# Patient Record
Sex: Male | Born: 1970
Health system: Southern US, Community
[De-identification: ages and names within clinical notes are randomized; demographics above are authoritative.]

## PROBLEM LIST (undated history)

## (undated) DIAGNOSIS — D802 Selective deficiency of immunoglobulin A [IgA]: Secondary | ICD-10-CM

## (undated) DIAGNOSIS — J159 Unspecified bacterial pneumonia: Secondary | ICD-10-CM

## (undated) DIAGNOSIS — K219 Gastro-esophageal reflux disease without esophagitis: Secondary | ICD-10-CM

## (undated) HISTORY — DX: Gastro-esophageal reflux disease without esophagitis: K21.9

## (undated) HISTORY — PX: MOUTH SURGERY: SHX715

## (undated) HISTORY — DX: Unspecified bacterial pneumonia: J15.9

## (undated) HISTORY — PX: ESOPHAGOGASTRODUODENOSCOPY: SHX1529

## (undated) HISTORY — PX: VASECTOMY: SHX75

---

## 2003-01-03 ENCOUNTER — Encounter: Admission: RE | Admit: 2003-01-03 | Discharge: 2003-01-03 | Payer: Self-pay | Admitting: Family Medicine

## 2003-01-03 ENCOUNTER — Encounter: Payer: Self-pay | Admitting: Family Medicine

## 2003-07-22 ENCOUNTER — Ambulatory Visit (HOSPITAL_COMMUNITY): Admission: RE | Admit: 2003-07-22 | Discharge: 2003-07-22 | Payer: Self-pay | Admitting: Family Medicine

## 2003-07-22 ENCOUNTER — Emergency Department (HOSPITAL_COMMUNITY): Admission: EM | Admit: 2003-07-22 | Discharge: 2003-07-22 | Payer: Self-pay | Admitting: Emergency Medicine

## 2009-08-08 DIAGNOSIS — J159 Unspecified bacterial pneumonia: Secondary | ICD-10-CM

## 2009-08-08 HISTORY — DX: Unspecified bacterial pneumonia: J15.9

## 2009-08-26 ENCOUNTER — Inpatient Hospital Stay (HOSPITAL_COMMUNITY): Admission: AD | Admit: 2009-08-26 | Discharge: 2009-08-29 | Payer: Self-pay | Admitting: Internal Medicine

## 2009-09-27 ENCOUNTER — Encounter: Admission: RE | Admit: 2009-09-27 | Discharge: 2009-09-27 | Payer: Self-pay | Admitting: Family Medicine

## 2010-09-02 LAB — DIFFERENTIAL
Basophils Absolute: 0 10*3/uL (ref 0.0–0.1)
Basophils Absolute: 0 10*3/uL (ref 0.0–0.1)
Eosinophils Absolute: 0 10*3/uL (ref 0.0–0.7)
Eosinophils Relative: 0 % (ref 0–5)
Lymphocytes Relative: 14 % (ref 12–46)
Lymphocytes Relative: 5 % — ABNORMAL LOW (ref 12–46)
Lymphs Abs: 1.7 10*3/uL (ref 0.7–4.0)
Monocytes Relative: 3 % (ref 3–12)
Neutro Abs: 17.7 10*3/uL — ABNORMAL HIGH (ref 1.7–7.7)
Neutrophils Relative %: 80 % — ABNORMAL HIGH (ref 43–77)
Neutrophils Relative %: 92 % — ABNORMAL HIGH (ref 43–77)

## 2010-09-02 LAB — CULTURE, BLOOD (ROUTINE X 2): Culture: NO GROWTH

## 2010-09-02 LAB — EXPECTORATED SPUTUM ASSESSMENT W GRAM STAIN, RFLX TO RESP C

## 2010-09-02 LAB — COMPREHENSIVE METABOLIC PANEL
Albumin: 2 g/dL — ABNORMAL LOW (ref 3.5–5.2)
BUN: 25 mg/dL — ABNORMAL HIGH (ref 6–23)
Creatinine, Ser: 1.08 mg/dL (ref 0.4–1.5)
Glucose, Bld: 102 mg/dL — ABNORMAL HIGH (ref 70–99)
Total Bilirubin: 0.8 mg/dL (ref 0.3–1.2)
Total Protein: 6.3 g/dL (ref 6.0–8.3)

## 2010-09-02 LAB — BASIC METABOLIC PANEL
BUN: 18 mg/dL (ref 6–23)
CO2: 27 mEq/L (ref 19–32)
Calcium: 7.9 mg/dL — ABNORMAL LOW (ref 8.4–10.5)
Calcium: 8.1 mg/dL — ABNORMAL LOW (ref 8.4–10.5)
Chloride: 103 mEq/L (ref 96–112)
Chloride: 107 mEq/L (ref 96–112)
Creatinine, Ser: 0.9 mg/dL (ref 0.4–1.5)
Creatinine, Ser: 0.96 mg/dL (ref 0.4–1.5)
GFR calc Af Amer: >60 mL/min (ref >60)
GFR calc Af Amer: >60 mL/min (ref >60)
GFR calc non Af Amer: >60 mL/min (ref >60)
Glucose, Bld: 100 mg/dL — ABNORMAL HIGH (ref 70–99)
Potassium: 3.7 mEq/L (ref 3.5–5.1)
Potassium: 4.4 mEq/L (ref 3.5–5.1)
Sodium: 136 mEq/L (ref 135–145)
Sodium: 140 mEq/L (ref 135–145)

## 2010-09-02 LAB — URINALYSIS, ROUTINE W REFLEX MICROSCOPIC
Bilirubin Urine: NEGATIVE
Glucose, UA: NEGATIVE mg/dL
Hgb urine dipstick: NEGATIVE
Ketones, ur: NEGATIVE mg/dL
Protein, ur: 100 mg/dL — AB
Urobilinogen, UA: 1 mg/dL (ref 0.0–1.0)

## 2010-09-02 LAB — CBC
HCT: 33.3 % — ABNORMAL LOW (ref 39.0–52.0)
HCT: 35 % — ABNORMAL LOW (ref 39.0–52.0)
HCT: 35.3 % — ABNORMAL LOW (ref 39.0–52.0)
Hemoglobin: 11.6 g/dL — ABNORMAL LOW (ref 13.0–17.0)
Hemoglobin: 11.8 g/dL — ABNORMAL LOW (ref 13.0–17.0)
Hemoglobin: 11.9 g/dL — ABNORMAL LOW (ref 13.0–17.0)
MCHC: 33.8 g/dL (ref 30.0–36.0)
MCV: 89.3 fL (ref 78.0–100.0)
MCV: 89.4 fL (ref 78.0–100.0)
Platelets: 254 10*3/uL (ref 150–400)
Platelets: 286 10*3/uL (ref 150–400)
RBC: 3.82 MIL/uL — ABNORMAL LOW (ref 4.22–5.81)
RBC: 3.93 MIL/uL — ABNORMAL LOW (ref 4.22–5.81)
RDW: 13.4 % (ref 11.5–15.5)
RDW: 13.9 % (ref 11.5–15.5)
WBC: 12.3 10*3/uL — ABNORMAL HIGH (ref 4.0–10.5)
WBC: 9.7 10*3/uL (ref 4.0–10.5)

## 2010-09-02 LAB — MAGNESIUM: Magnesium: 2.5 mg/dL (ref 1.5–2.5)

## 2010-09-02 LAB — RAPID STREP SCREEN (MED CTR MEBANE ONLY): Streptococcus, Group A Screen (Direct): NEGATIVE

## 2010-09-02 LAB — LEGIONELLA ANTIGEN, URINE: Legionella Antigen, Urine: NEGATIVE

## 2010-09-02 LAB — CULTURE, RESPIRATORY W GRAM STAIN: Culture: NORMAL

## 2010-09-02 LAB — HIV ANTIBODY (ROUTINE TESTING W REFLEX): HIV: NONREACTIVE

## 2010-09-02 LAB — URINE MICROSCOPIC-ADD ON

## 2011-04-21 IMAGING — CR DG CHEST 2V
2 series · 2 of 2 positions shown · non-contrast
Comparison: None.

CLINICAL DATA: Pneumonia.

CHEST - 2 VIEW

[w chest pa]
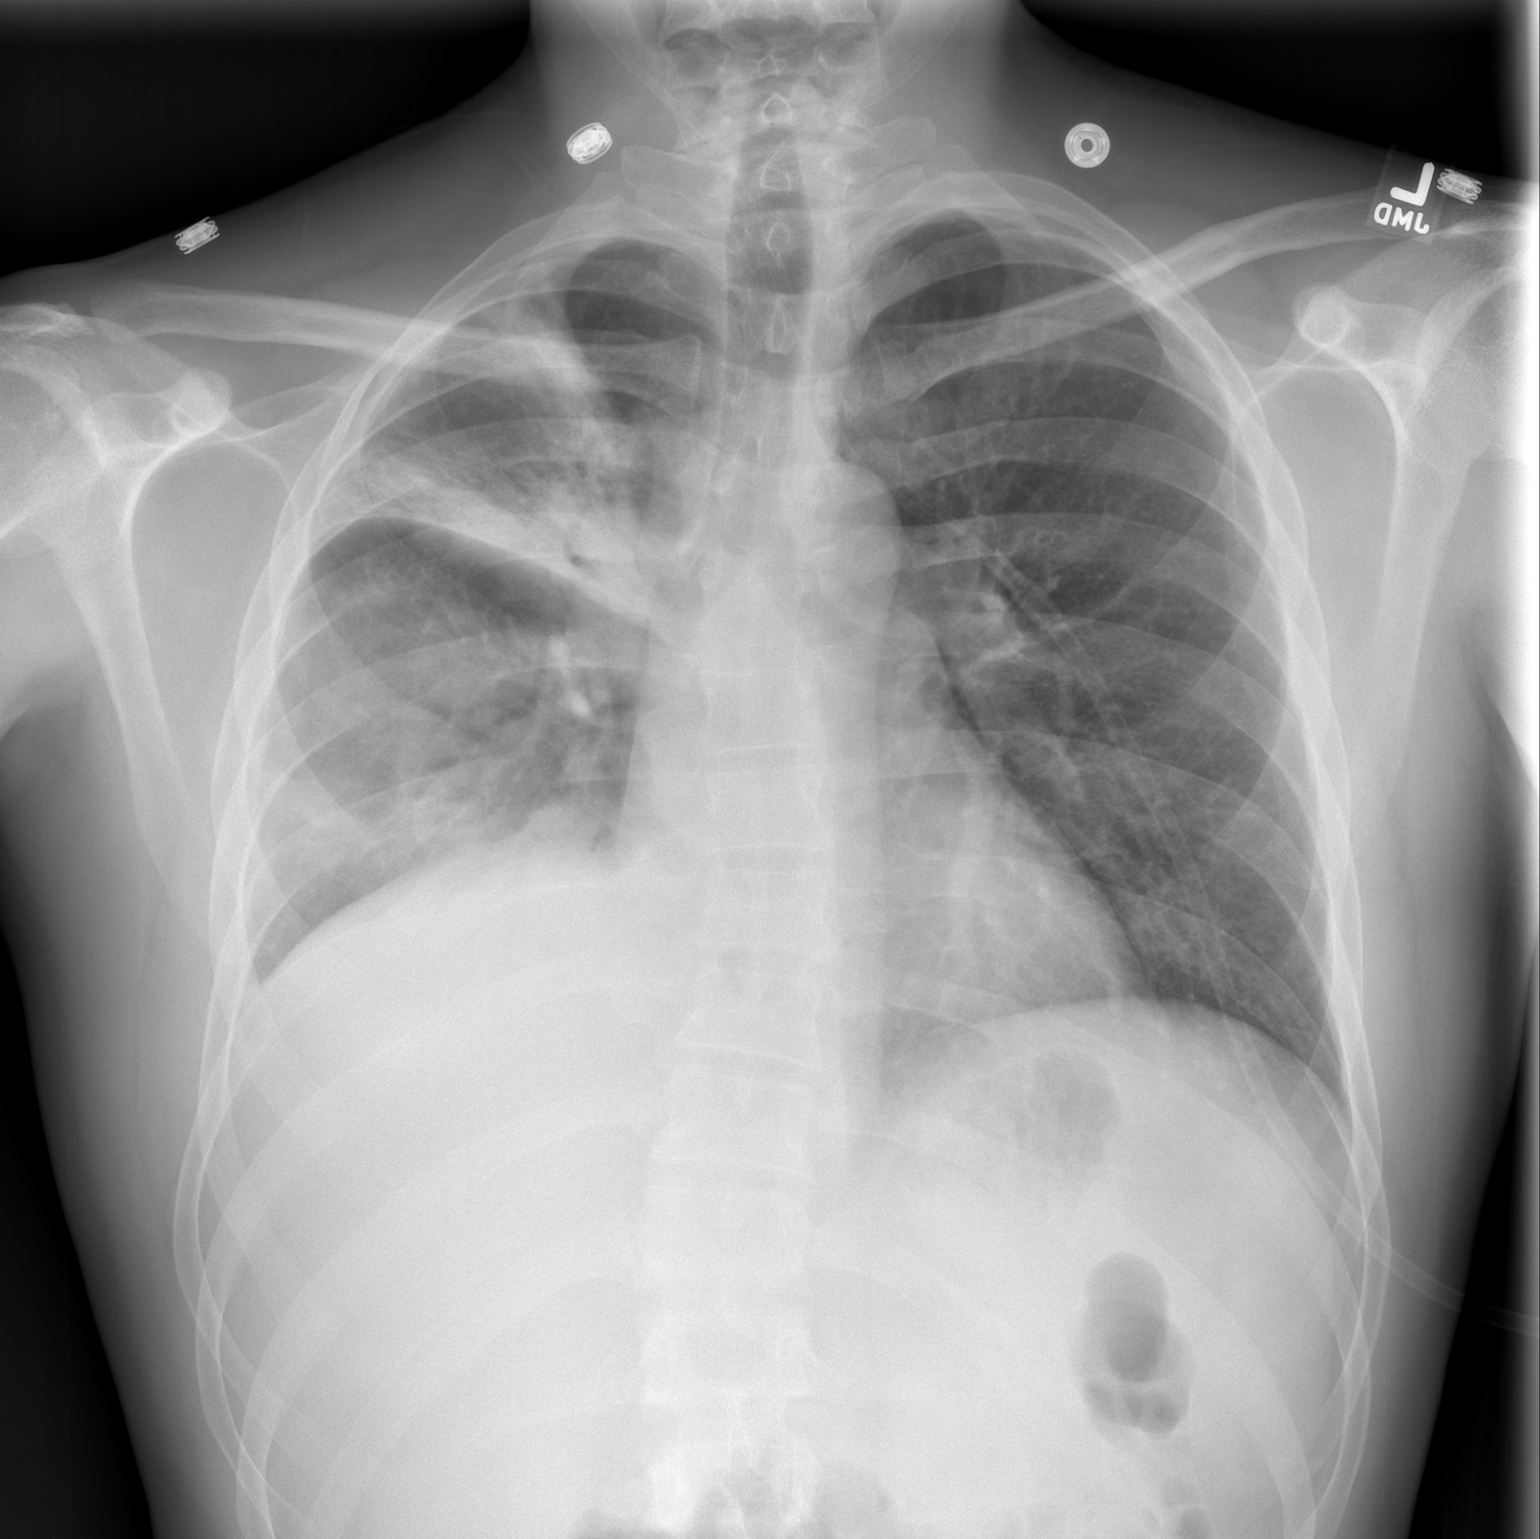

[w chest lat]
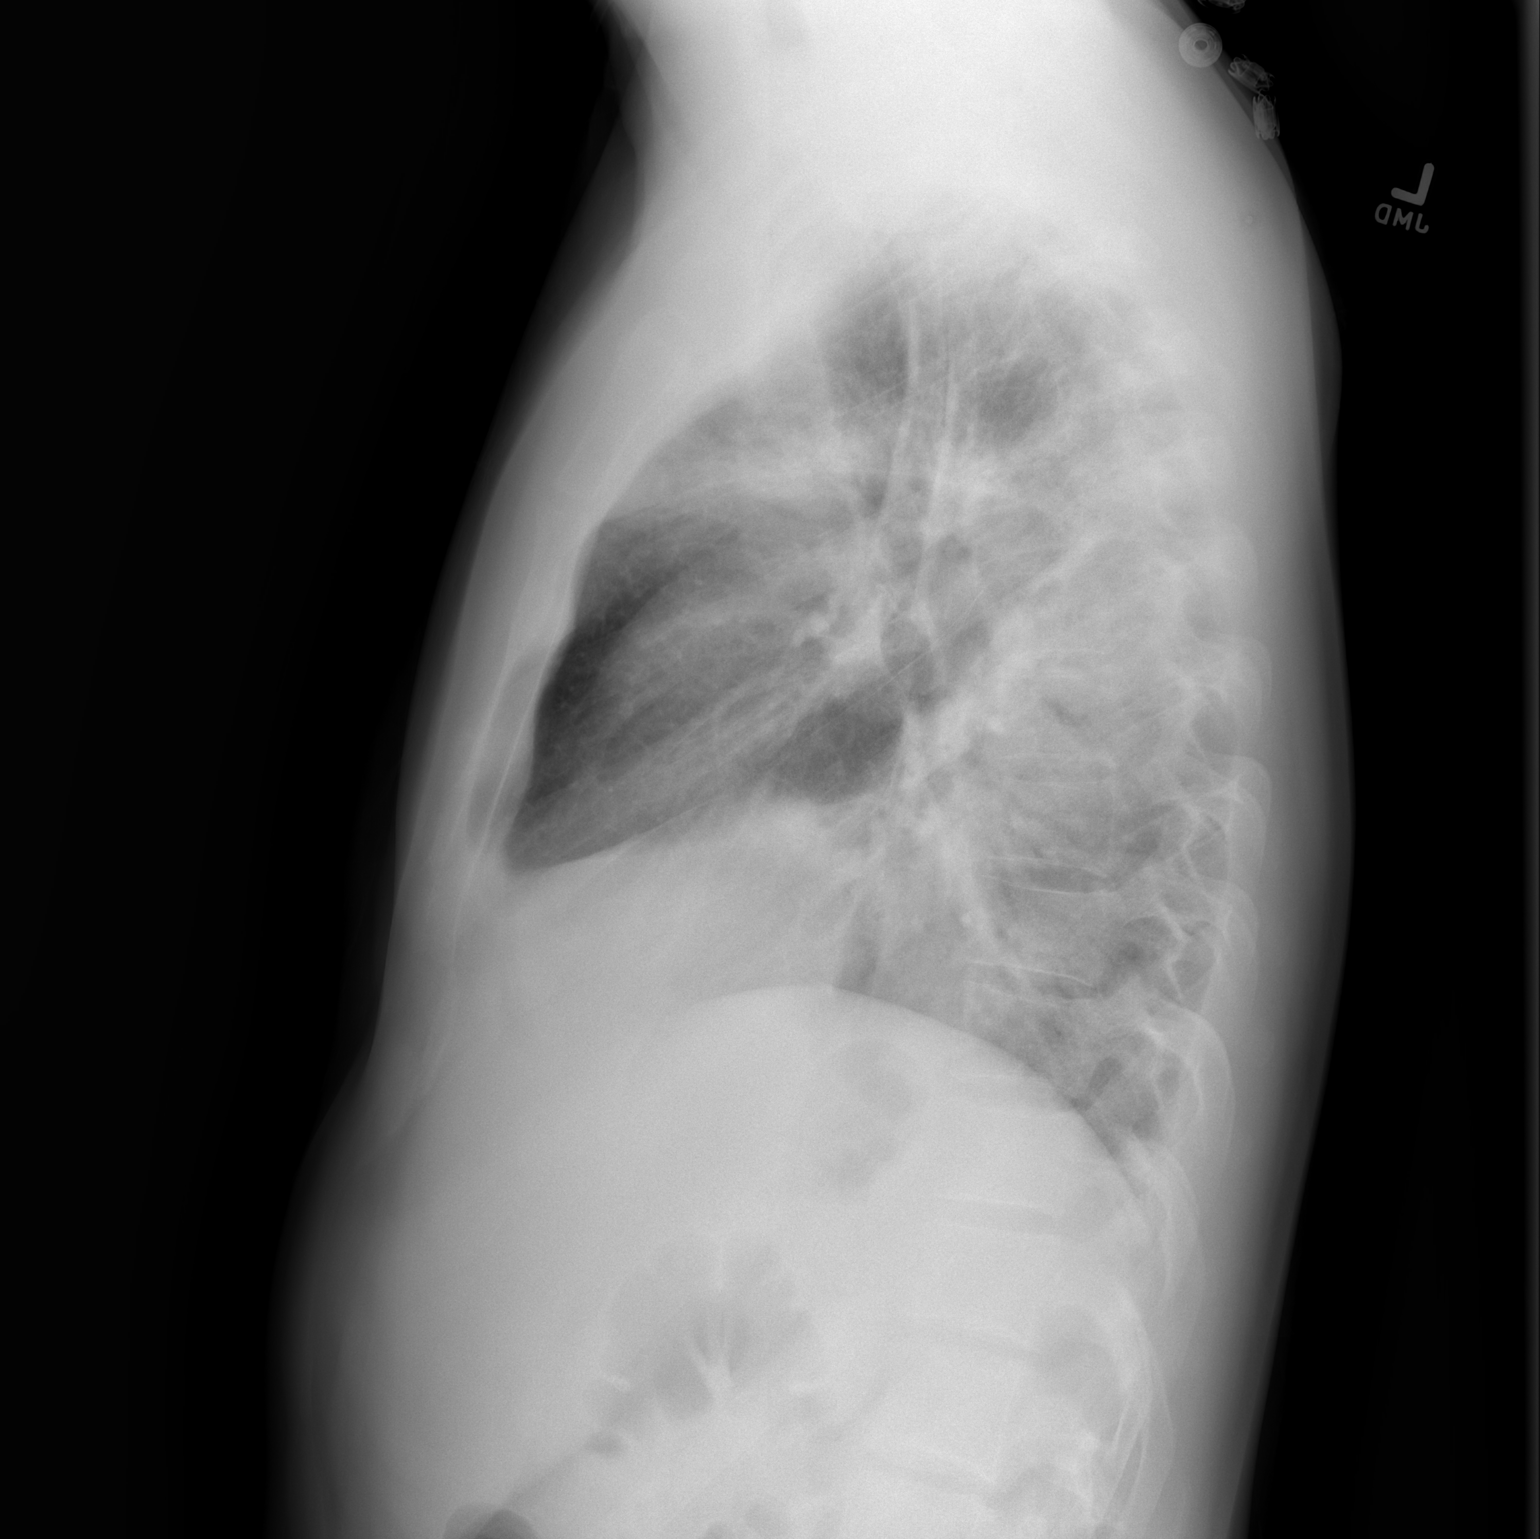

[2 of 2 positions shown; findings below may reference images not displayed]

FINDINGS: The patient has extensive airspace disease in the right
upper, right middle and right lower lobes.  There is a small right
effusion.  Left lung is clear.
IMPRESSION: Extensive right side airspace disease consistent with pneumonia.
Recommend follow-up films to clearing.

## 2011-07-04 ENCOUNTER — Encounter: Payer: Self-pay | Admitting: Emergency Medicine

## 2011-07-05 ENCOUNTER — Encounter: Payer: Self-pay | Admitting: Emergency Medicine

## 2011-07-05 ENCOUNTER — Ambulatory Visit (INDEPENDENT_AMBULATORY_CARE_PROVIDER_SITE_OTHER): Payer: BC Managed Care – PPO | Admitting: Emergency Medicine

## 2011-07-05 ENCOUNTER — Ambulatory Visit: Payer: BC Managed Care – PPO

## 2011-07-05 DIAGNOSIS — J189 Pneumonia, unspecified organism: Secondary | ICD-10-CM

## 2011-07-05 DIAGNOSIS — K219 Gastro-esophageal reflux disease without esophagitis: Secondary | ICD-10-CM | POA: Insufficient documentation

## 2011-07-05 DIAGNOSIS — J159 Unspecified bacterial pneumonia: Secondary | ICD-10-CM

## 2011-07-05 DIAGNOSIS — J309 Allergic rhinitis, unspecified: Secondary | ICD-10-CM | POA: Insufficient documentation

## 2011-07-05 NOTE — Patient Instructions (Signed)
We will check your immunoglobulins and call you with the results Consider starting an allergy regimen during the Spring and fall months.  If you experience another pneumonia, I would recommend a high resolution CT scan of your chest. Please call Dr Delton Coombes if you are treated for pneumonia or develop any problems.

## 2011-07-05 NOTE — Assessment & Plan Note (Addendum)
Recurrent PNA's can be caused by chronic aspiration and reflux, or influenced by chronic allergic rhinitis. More often PNA's recur due to anatomical changes such as bronchiectasis, whatever the underlying cause. There is no evidence here to suggest a post obstructive process. His more recent left upper lobe pneumonia from December 2012 is a very subtle finding on chest x-ray. It may be that this was truly a bronchitis which is somewhat reassuring. The fact that his initial infection was in the right upper lobe while the second episode may have involved the left upper lobe makes an anatomical cause unlikely. At this point I believe we should rule out simple immunoglobulin deficiency. If he shows evidence of recurrent infection or if his chest x-ray changes such that we are more concerned about bronchiectasis then I would favor a high-resolution CT scan of the chest.  - check quant immunoglob - consider high res CT scan if he has a recurrence - I will call him with lab results

## 2011-07-05 NOTE — Assessment & Plan Note (Signed)
It may be beneficial to treat him for allergic rhinitis during the spring and fall, This may decrease overall airway inflammation and his chances to develop recurrent infection.

## 2011-07-05 NOTE — Progress Notes (Signed)
Subjective:    Patient ID: Donald Warner, male    DOB: 1970-09-09, 41 y.o.   MRN: [id number] HPI 41 yo man, very light tobacco hx, hx allergic rhinitis, GERD. He is referred by Dr Manus Gunning regarding 2 PNA's in the last 2 years, the first in 08/2009, the more recent in 05/2011. 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 Has lived in [place], no [employer]. No family hx of PNA's.   Denies aspiration symptoms, has always been active and able to workout. He has hx frequent sinus drainage in the winter months. He has allergy sx in the Spring and Fall with a lot of drainage, hoarse voice. Taking PPI prn for occasional heartburn.   In 2011 he had flu then developed RUL infiltrate and PNA. In 12/12 he developed what sounded like URI, then became febrile, had chills, CXR showed ? LUL infiltrate. Was treated with Levaquin.    Review of Systems  Constitutional: Negative.  Negative for fever and unexpected weight change.  HENT: Negative.  Negative for ear pain, nosebleeds, congestion, sore throat, rhinorrhea, sneezing, trouble swallowing, dental problem, postnasal drip and sinus pressure.   Eyes: Negative.  Negative for redness and itching.  Respiratory: Positive for shortness of breath. Negative for cough, chest tightness and wheezing.   Cardiovascular: Negative.  Negative for palpitations and leg swelling.  Gastrointestinal: Negative.  Negative for nausea and vomiting.  Genitourinary: Negative.  Negative for dysuria.  Musculoskeletal: Negative.  Negative for joint swelling.  Skin: Negative.  Negative for rash.  Neurological: Negative.  Negative for headaches.  Hematological: Negative.  Does not bruise/bleed easily.  Psychiatric/Behavioral: Negative.  Negative for dysphoric mood. The patient is not nervous/anxious.     Past Medical History  Diagnosis Date  . GERD (gastroesophageal reflux disease)   . Bacterial pneumonia March 2011  . ALLERGIC RHINITIS        Family History  Problem Relation Age of Onset  . Heart disease Father     s/p CABG  . Lung cancer Paternal Grandmother   . Emphysema Paternal Grandfather      History   Social History  . Marital Status: Married    Spouse Name: N/A    Number of Children: 3  . Years of Education: N/A   Occupational History  . Sales    Social History Main Topics  . Smoking status: Former Smoker -- 0.1 packs/day for 3 years    Types: Cigarettes    Quit date: 06/10/1988  . Smokeless tobacco: Never Used  . Alcohol Use: No  . Drug Use: No  . Sexually Active: Not on file   Other Topics Concern  . Not on file   Social History Narrative  . No narrative on file     Allergies not on file   Outpatient Prescriptions Prior to Visit  Medication Sig Dispense Refill  . aspirin 81 MG tablet Take 81 mg by mouth daily.      . cholecalciferol (VITAMIN D) 1000 UNITS tablet Take 1,000 Units by mouth daily.      Marland Kitchen dexlansoprazole (DEXILANT) 60 MG capsule Take 60 mg by mouth daily as needed.            Objective:   Physical Exam  Gen: Pleasant, well-nourished, in no distress,  normal affect  ENT: No lesions,  mouth clear,  oropharynx clear, no postnasal drip  Neck: No JVD, no TMG, no carotid bruits  Lungs: No use of accessory muscles,  no dullness to percussion, clear without rales or rhonchi  Cardiovascular: RRR, heart sounds normal, no murmur or gallops, no peripheral edema  Musculoskeletal: No deformities, no cyanosis or clubbing  Neuro: alert, non focal  Skin: Warm, no lesions or rashes     Assessment & Plan:  Pneumonia Recurrent PNA's can be caused by chronic aspiration and reflux, or influenced by chronic allergic rhinitis. More often PNA's recur due to anatomical changes such as bronchiectasis, whatever the underlying cause. There is no evidence here to suggest a post obstructive process. His more recent left upper lobe pneumonia from December 2012 is a very subtle finding on chest  x-ray. It may be that this was truly a bronchitis which is somewhat reassuring. The fact that his initial infection was in the right upper lobe while the second episode may have involved the left upper lobe makes an anatomical cause unlikely. At this point I believe we should rule out simple immunoglobulin deficiency. If he shows evidence of recurrent infection or if his chest x-ray changes such that we are more concerned about bronchiectasis then I would favor a high-resolution CT scan of the chest.  - check quant immunoglob - consider high res CT scan if he has a recurrence - I will call him with lab results  Allergic rhinitis It may be beneficial to treat him for allergic rhinitis during the spring and fall, This may decrease overall airway inflammation and his chances to develop recurrent infection.

## 2011-07-06 LAB — IGG, IGA, IGM: IgM, Serum: 86 mg/dL (ref 41–251)

## 2011-07-09 ENCOUNTER — Telehealth: Payer: Self-pay | Admitting: Emergency Medicine

## 2011-07-09 NOTE — Telephone Encounter (Signed)
Pt is requesting lab results from 07-05-11. Please advise. Carron Curie, CMA

## 2011-07-09 NOTE — Telephone Encounter (Signed)
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 I discussed this with him. He is at higher risk for sinopulmonary infections, will need good vaccinations, to avoid URI's, to treat allergies. He is going to discuss status with his PCP.

## 2012-04-15 ENCOUNTER — Other Ambulatory Visit: Payer: Self-pay | Admitting: Family Medicine

## 2012-04-15 ENCOUNTER — Ambulatory Visit
Admission: RE | Admit: 2012-04-15 | Discharge: 2012-04-15 | Disposition: A | Discharge status: Home / Self Care | Payer: BC Managed Care – PPO | Source: Ambulatory Visit | Attending: Family Medicine | Admitting: Family Medicine

## 2012-04-15 DIAGNOSIS — R519 Headache, unspecified: Secondary | ICD-10-CM

## 2012-04-17 ENCOUNTER — Other Ambulatory Visit: Payer: Self-pay | Admitting: Family Medicine

## 2012-04-17 DIAGNOSIS — R51 Headache: Secondary | ICD-10-CM

## 2012-04-20 ENCOUNTER — Ambulatory Visit
Admission: RE | Admit: 2012-04-20 | Discharge: 2012-04-20 | Disposition: A | Discharge status: Home / Self Care | Payer: BC Managed Care – PPO | Source: Ambulatory Visit | Attending: Family Medicine | Admitting: Family Medicine

## 2012-04-20 DIAGNOSIS — R51 Headache: Secondary | ICD-10-CM

## 2012-04-20 MED ORDER — IOHEXOL 350 MG/ML SOLN
100.0000 mL | Freq: Once | INTRAVENOUS | Status: AC | PRN
  Administered 2012-04-20: 100 mL via INTRAVENOUS

## 2012-04-22 ENCOUNTER — Other Ambulatory Visit: Payer: BC Managed Care – PPO

## 2013-12-09 IMAGING — CT CT HEAD W/O CM
2 series · 16 of 30 positions shown, 20 images · non-contrast
Comparison: None.

CLINICAL DATA: Post exercise occipital headache.

CT HEAD WITHOUT CONTRAST
TECHNIQUE: Contiguous axial images were obtained from the base of
the skull through the vertex without contrast.

[Series 3: head bone · axial · 0.49mm/px · z∈[+28,+74]mm · 3 of 32 slices shown]
[im 3/32  bone]
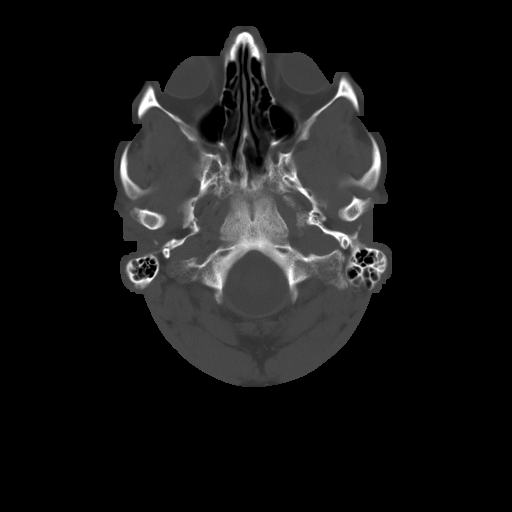
[im 7/32  bone]
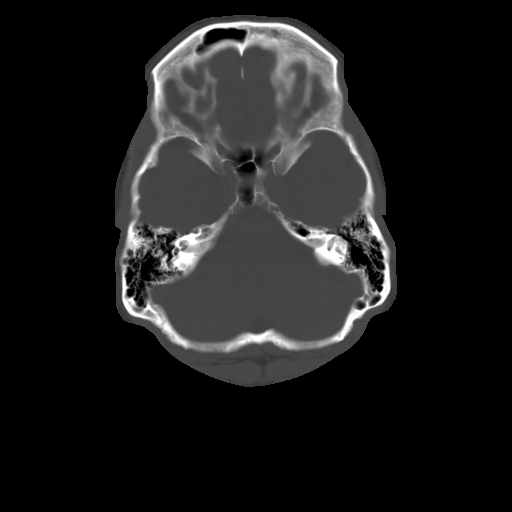
[im 12/32  bone]
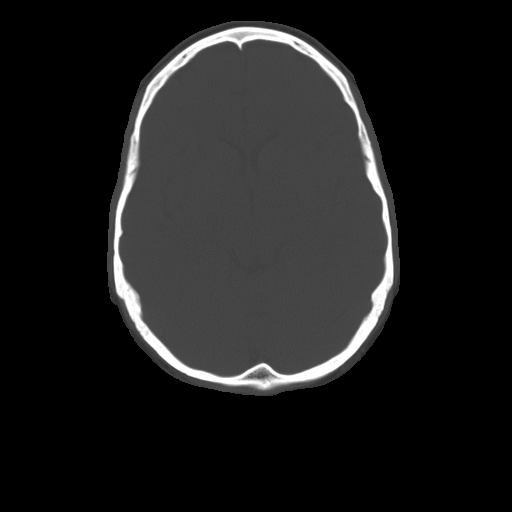

[Series 32: 3d filtered head w/o · axial · non-contrast · 0.49mm/px · z∈[+28,+162]mm · 13 of 32 slices shown, 17 images]
[im 3/32  brain]
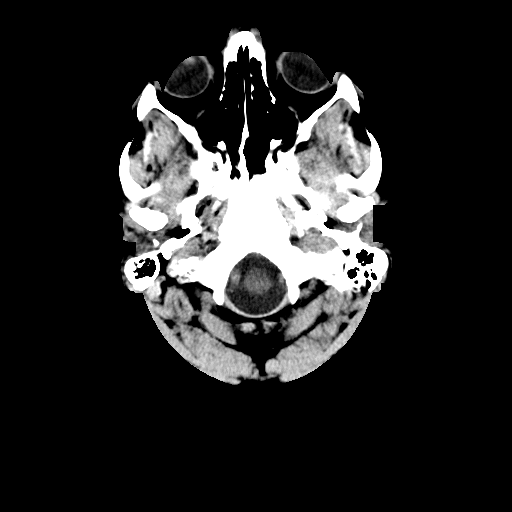
[im 3/32  bone]
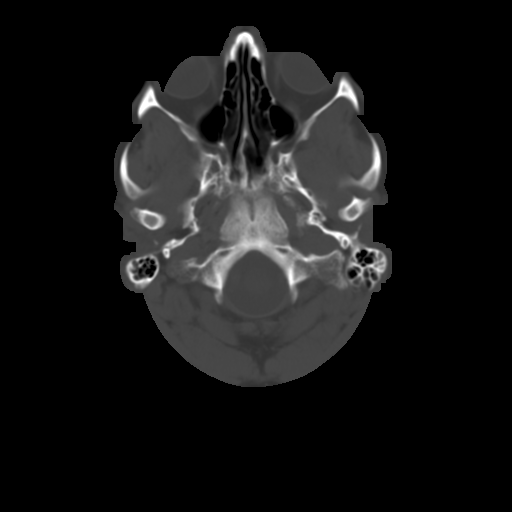
[im 5/32  brain]
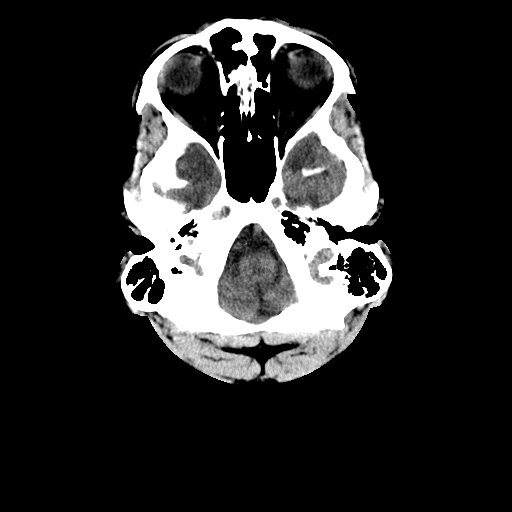
[im 7/32  brain]
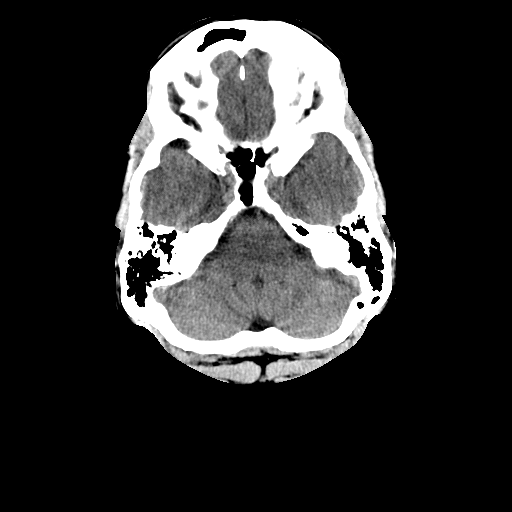
[im 9/32  brain]
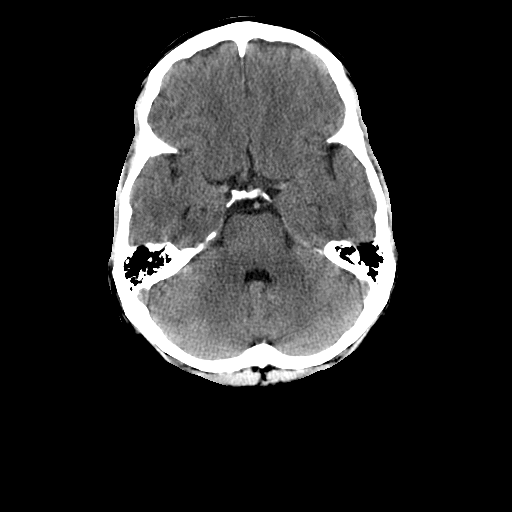
[im 12/32  brain]
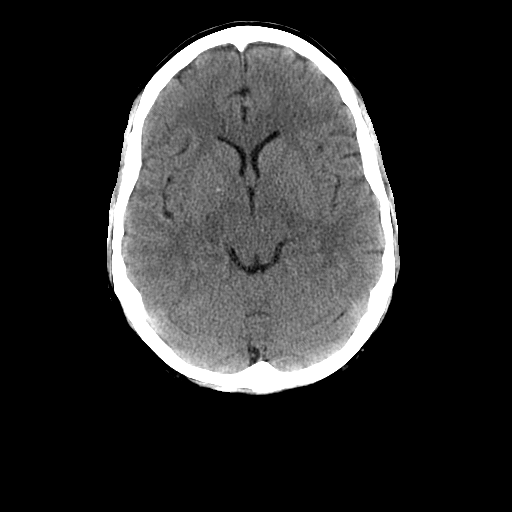
[im 12/32  bone]
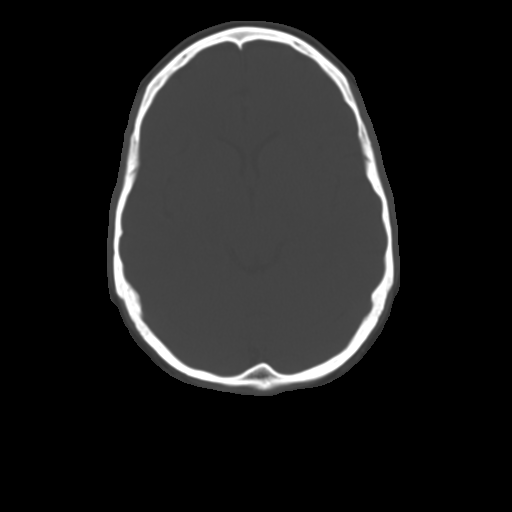
[im 14/32  brain]
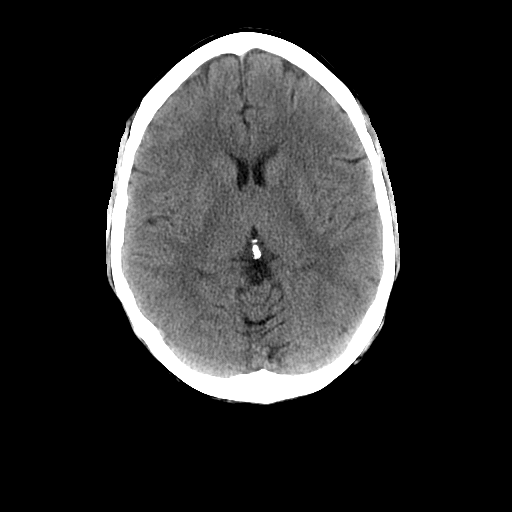
[im 16/32  brain]
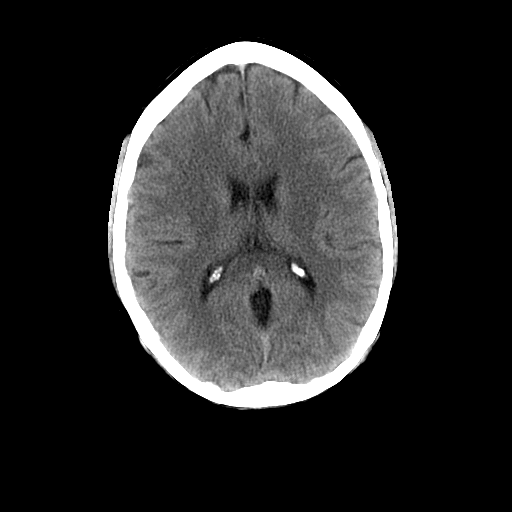
[im 18/32  brain]
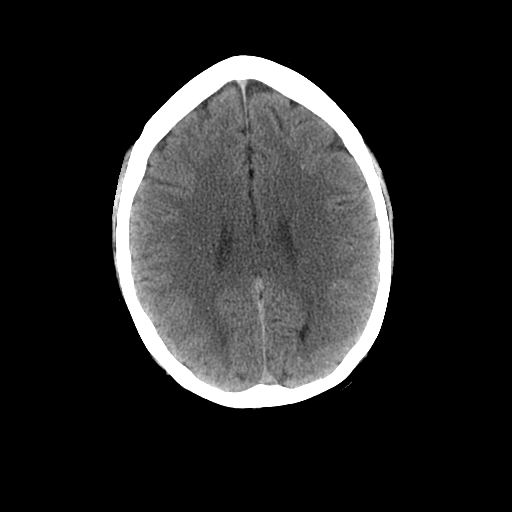
[im 20/32  brain]
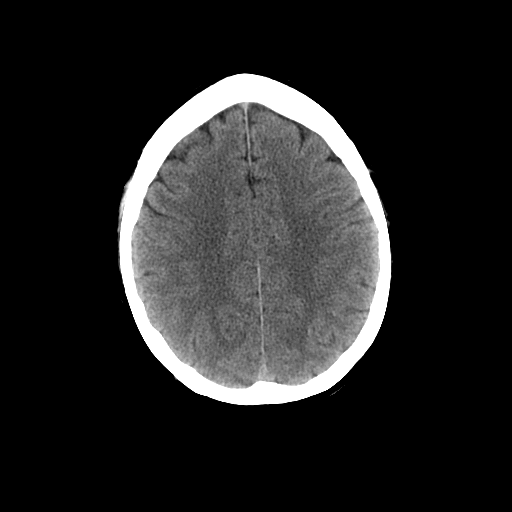
[im 20/32  bone]
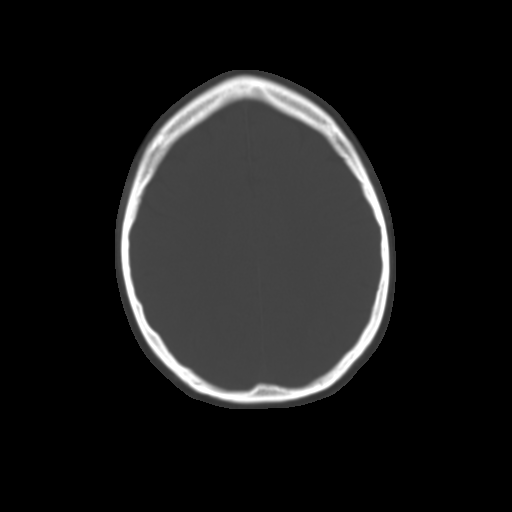
[im 23/32  brain]
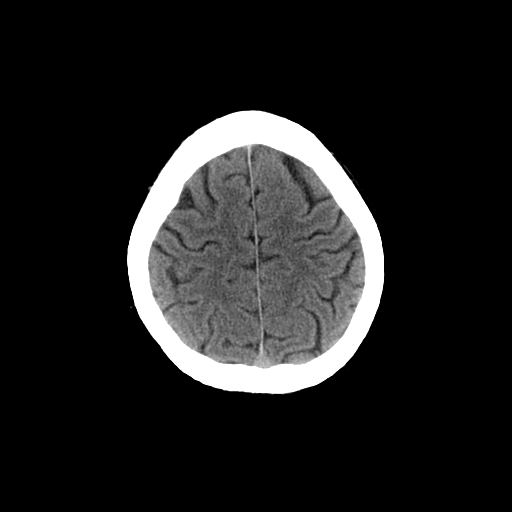
[im 25/32  brain]
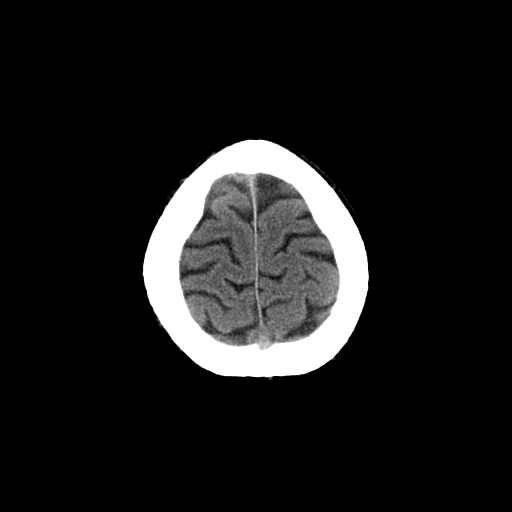
[im 27/32  brain]
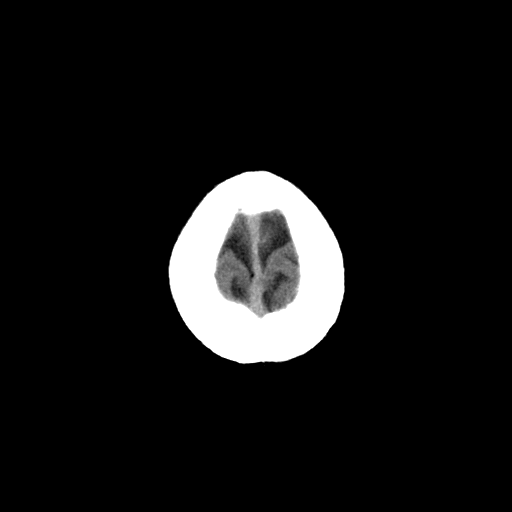
[im 29/32  brain]
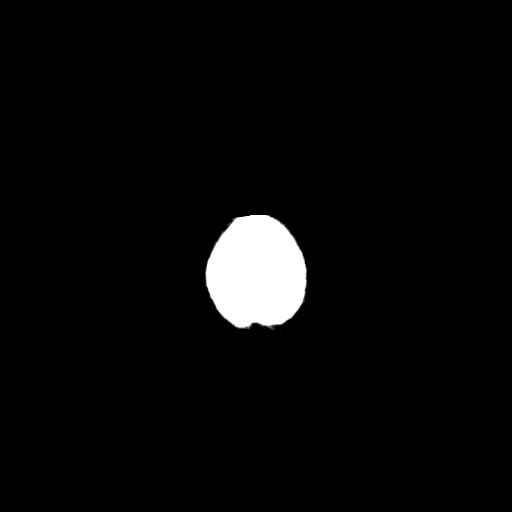
[im 29/32  bone]
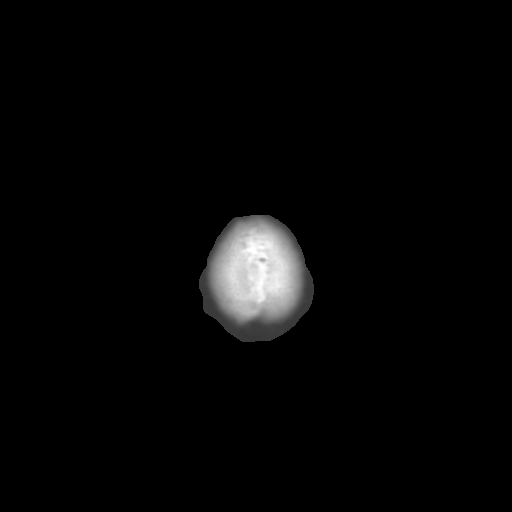

[16 of 30 positions shown; findings below may reference images not displayed]

FINDINGS: The brain stem, cerebellum, cerebral peduncles, thalami,
basal ganglia, basilar cisterns, and ventricular system appear
unremarkable.

Linear lucency in the left upper frontoparietal region on image 24
of series 3 is thought to represent volume averaging of a sulcus.

No intracranial hemorrhage, mass lesion, or acute infarction is
identified.
IMPRESSION: 1.  No significant intracranial abnormality is observed.

## 2014-12-01 ENCOUNTER — Other Ambulatory Visit: Payer: Self-pay | Admitting: Orthopedic Surgery

## 2015-02-01 ENCOUNTER — Encounter (HOSPITAL_BASED_OUTPATIENT_CLINIC_OR_DEPARTMENT_OTHER): Payer: Self-pay | Admitting: *Deleted

## 2015-02-02 ENCOUNTER — Ambulatory Visit (HOSPITAL_BASED_OUTPATIENT_CLINIC_OR_DEPARTMENT_OTHER)
Admission: RE | Admit: 2015-02-02 | Discharge: 2015-02-02 | Disposition: A | Discharge status: Home / Self Care | Payer: BLUE CROSS/BLUE SHIELD | Source: Ambulatory Visit | Attending: Orthopedic Surgery | Admitting: Orthopedic Surgery

## 2015-02-02 ENCOUNTER — Ambulatory Visit (HOSPITAL_BASED_OUTPATIENT_CLINIC_OR_DEPARTMENT_OTHER): Payer: BLUE CROSS/BLUE SHIELD | Admitting: Anesthesiology

## 2015-02-02 ENCOUNTER — Encounter (HOSPITAL_BASED_OUTPATIENT_CLINIC_OR_DEPARTMENT_OTHER)
Admission: RE | Disposition: A | Discharge status: Home / Self Care | Payer: Self-pay | Source: Ambulatory Visit | Attending: Orthopedic Surgery

## 2015-02-02 ENCOUNTER — Encounter (HOSPITAL_BASED_OUTPATIENT_CLINIC_OR_DEPARTMENT_OTHER): Payer: Self-pay | Admitting: Anesthesiology

## 2015-02-02 DIAGNOSIS — I10 Essential (primary) hypertension: Secondary | ICD-10-CM | POA: Insufficient documentation

## 2015-02-02 DIAGNOSIS — J309 Allergic rhinitis, unspecified: Secondary | ICD-10-CM | POA: Insufficient documentation

## 2015-02-02 DIAGNOSIS — Z87891 Personal history of nicotine dependence: Secondary | ICD-10-CM | POA: Insufficient documentation

## 2015-02-02 DIAGNOSIS — D481 Neoplasm of uncertain behavior of connective and other soft tissue: Secondary | ICD-10-CM | POA: Insufficient documentation

## 2015-02-02 DIAGNOSIS — K219 Gastro-esophageal reflux disease without esophagitis: Secondary | ICD-10-CM | POA: Insufficient documentation

## 2015-02-02 DIAGNOSIS — D802 Selective deficiency of immunoglobulin A [IgA]: Secondary | ICD-10-CM | POA: Diagnosis not present

## 2015-02-02 DIAGNOSIS — R229 Localized swelling, mass and lump, unspecified: Secondary | ICD-10-CM | POA: Diagnosis present

## 2015-02-02 HISTORY — DX: Selective deficiency of immunoglobulin a (iga): D80.2

## 2015-02-02 HISTORY — PX: MASS EXCISION: SHX2000

## 2015-02-02 SURGERY — EXCISION MASS
Anesthesia: General | Site: Wrist | Laterality: Left

## 2015-02-02 MED ORDER — CEFAZOLIN SODIUM-DEXTROSE 2-3 GM-% IV SOLR
INTRAVENOUS | Status: AC
  Filled 2015-02-02: qty 50

## 2015-02-02 MED ORDER — FENTANYL CITRATE (PF) 100 MCG/2ML IJ SOLN
50.0000 ug | INTRAMUSCULAR | Status: DC | PRN
  Administered 2015-02-02: 25 ug via INTRAVENOUS
  Administered 2015-02-02: 100 ug via INTRAVENOUS

## 2015-02-02 MED ORDER — FENTANYL CITRATE (PF) 100 MCG/2ML IJ SOLN
INTRAMUSCULAR | Status: AC
  Filled 2015-02-02: qty 4

## 2015-02-02 MED ORDER — MIDAZOLAM HCL 2 MG/2ML IJ SOLN
1.0000 mg | INTRAMUSCULAR | Status: DC | PRN
  Administered 2015-02-02: 2 mg via INTRAVENOUS

## 2015-02-02 MED ORDER — LIDOCAINE HCL (CARDIAC) 20 MG/ML IV SOLN
INTRAVENOUS | Status: DC | PRN
  Administered 2015-02-02: 100 mg via INTRAVENOUS

## 2015-02-02 MED ORDER — PROPOFOL 10 MG/ML IV BOLUS
INTRAVENOUS | Status: DC | PRN
  Administered 2015-02-02: 200 mg via INTRAVENOUS

## 2015-02-02 MED ORDER — BUPIVACAINE HCL (PF) 0.25 % IJ SOLN
INTRAMUSCULAR | Status: AC
  Filled 2015-02-02: qty 150

## 2015-02-02 MED ORDER — CEFAZOLIN SODIUM-DEXTROSE 2-3 GM-% IV SOLR: 2.0000 g | INTRAVENOUS | Status: DC

## 2015-02-02 MED ORDER — CHLORHEXIDINE GLUCONATE 4 % EX LIQD: 60.0000 mL | Freq: Once | CUTANEOUS | Status: DC

## 2015-02-02 MED ORDER — GLYCOPYRROLATE 0.2 MG/ML IJ SOLN: 0.2000 mg | Freq: Once | INTRAMUSCULAR | Status: DC | PRN

## 2015-02-02 MED ORDER — HYDROCODONE-ACETAMINOPHEN 5-325 MG PO TABS: 1.0000 | ORAL_TABLET | Freq: Four times a day (QID) | ORAL | Status: DC | PRN

## 2015-02-02 MED ORDER — LACTATED RINGERS IV SOLN
INTRAVENOUS | Status: DC
  Administered 2015-02-02: 09:00:00 via INTRAVENOUS

## 2015-02-02 MED ORDER — DEXAMETHASONE SODIUM PHOSPHATE 10 MG/ML IJ SOLN
INTRAMUSCULAR | Status: DC | PRN
  Administered 2015-02-02: 10 mg via INTRAVENOUS

## 2015-02-02 MED ORDER — ONDANSETRON HCL 4 MG/2ML IJ SOLN
INTRAMUSCULAR | Status: DC | PRN
  Administered 2015-02-02: 4 mg via INTRAVENOUS

## 2015-02-02 MED ORDER — CEFAZOLIN SODIUM-DEXTROSE 2-3 GM-% IV SOLR
2.0000 g | INTRAVENOUS | Status: AC
  Administered 2015-02-02: 2 g via INTRAVENOUS

## 2015-02-02 MED ORDER — BUPIVACAINE HCL (PF) 0.25 % IJ SOLN
INTRAMUSCULAR | Status: DC | PRN
  Administered 2015-02-02: 6.5 mL

## 2015-02-02 MED ORDER — SCOPOLAMINE 1 MG/3DAYS TD PT72: 1.0000 | MEDICATED_PATCH | Freq: Once | TRANSDERMAL | Status: DC | PRN

## 2015-02-02 MED ORDER — MIDAZOLAM HCL 2 MG/2ML IJ SOLN
INTRAMUSCULAR | Status: AC
  Filled 2015-02-02: qty 2

## 2015-02-02 SURGICAL SUPPLY — 43 items
BANDAGE COBAN STERILE 2 (GAUZE/BANDAGES/DRESSINGS) IMPLANT
BLADE SURG 15 STRL LF DISP TIS (BLADE) ×1 IMPLANT
BLADE SURG 15 STRL SS (BLADE) ×3
BNDG CMPR 9X4 STRL LF SNTH (GAUZE/BANDAGES/DRESSINGS) ×1
BNDG COHESIVE 1X5 TAN STRL LF (GAUZE/BANDAGES/DRESSINGS) IMPLANT
BNDG COHESIVE 3X5 TAN STRL LF (GAUZE/BANDAGES/DRESSINGS) ×2 IMPLANT
BNDG ESMARK 4X9 LF (GAUZE/BANDAGES/DRESSINGS) ×2 IMPLANT
BNDG GAUZE ELAST 4 BULKY (GAUZE/BANDAGES/DRESSINGS) ×2 IMPLANT
CHLORAPREP W/TINT 26ML (MISCELLANEOUS) ×3 IMPLANT
CORDS BIPOLAR (ELECTRODE) ×3 IMPLANT
COVER BACK TABLE 60X90IN (DRAPES) ×3 IMPLANT
COVER MAYO STAND STRL (DRAPES) ×3 IMPLANT
CUFF TOURNIQUET SINGLE 18IN (TOURNIQUET CUFF) ×2 IMPLANT
DECANTER SPIKE VIAL GLASS SM (MISCELLANEOUS) IMPLANT
DRAIN PENROSE 1/2X12 LTX STRL (WOUND CARE) IMPLANT
DRAPE EXTREMITY T 121X128X90 (DRAPE) ×3 IMPLANT
DRAPE SURG 17X23 STRL (DRAPES) ×3 IMPLANT
GAUZE SPONGE 4X4 12PLY STRL (GAUZE/BANDAGES/DRESSINGS) ×3 IMPLANT
GAUZE XEROFORM 1X8 LF (GAUZE/BANDAGES/DRESSINGS) ×3 IMPLANT
GLOVE BIOGEL PI IND STRL 8.5 (GLOVE) ×1 IMPLANT
GLOVE BIOGEL PI INDICATOR 8.5 (GLOVE) ×2
GLOVE SURG ORTHO 8.0 STRL STRW (GLOVE) ×3 IMPLANT
GOWN STRL REUS W/ TWL LRG LVL3 (GOWN DISPOSABLE) ×1 IMPLANT
GOWN STRL REUS W/TWL LRG LVL3 (GOWN DISPOSABLE) ×3
GOWN STRL REUS W/TWL XL LVL3 (GOWN DISPOSABLE) ×3 IMPLANT
NDL PRECISIONGLIDE 27X1.5 (NEEDLE) IMPLANT
NDL SAFETY ECLIPSE 18X1.5 (NEEDLE) IMPLANT
NEEDLE HYPO 18GX1.5 SHARP (NEEDLE)
NEEDLE PRECISIONGLIDE 27X1.5 (NEEDLE) ×3 IMPLANT
NS IRRIG 1000ML POUR BTL (IV SOLUTION) ×3 IMPLANT
PACK BASIN DAY SURGERY FS (CUSTOM PROCEDURE TRAY) ×3 IMPLANT
PAD CAST 3X4 CTTN HI CHSV (CAST SUPPLIES) IMPLANT
PADDING CAST COTTON 3X4 STRL (CAST SUPPLIES) ×3
SPLINT PLASTER CAST XFAST 3X15 (CAST SUPPLIES) IMPLANT
SPLINT PLASTER XTRA FASTSET 3X (CAST SUPPLIES)
STOCKINETTE 4X48 STRL (DRAPES) ×3 IMPLANT
SUT ETHILON 4 0 PS 2 18 (SUTURE) ×3 IMPLANT
SUT MON AB 4-0 PC3 18 (SUTURE) ×2 IMPLANT
SUT VIC AB 4-0 P2 18 (SUTURE) IMPLANT
SYR BULB 3OZ (MISCELLANEOUS) ×3 IMPLANT
SYR CONTROL 10ML LL (SYRINGE) ×2 IMPLANT
TOWEL OR 17X24 6PK STRL BLUE (TOWEL DISPOSABLE) ×3 IMPLANT
UNDERPAD 30X30 (UNDERPADS AND DIAPERS) ×3 IMPLANT

## 2015-02-02 NOTE — Anesthesia Postprocedure Evaluation (Signed)
  Anesthesia Post-op Note  Patient: Donald Warner  Procedure(s) Performed: Procedure(s): EXCISION CYST SNUFFBOX LEFT WRIST (Left)  Patient Location: PACU  Anesthesia Type:General  Level of Consciousness: awake, alert  and oriented  Airway and Oxygen Therapy: Patient Spontanous Breathing  Post-op Pain: none  Post-op Assessment: Post-op Vital signs reviewed              Post-op Vital Signs: Reviewed  Last Vitals:  Filed Vitals:   02/02/15 1049  BP: 123/80  Pulse: 65  Temp: 36.5 C  Resp: 16    Complications: No apparent anesthesia complications

## 2015-02-02 NOTE — Brief Op Note (Signed)
02/02/2015  9:27 AM  PATIENT:  Donald Warner  44 y.o. male  PRE-OPERATIVE DIAGNOSIS:  LEFT RADIAL WRIST GANGLION  POST-OPERATIVE DIAGNOSIS:  LEFT RADIAL WRIST GANGLION  PROCEDURE:  Procedure(s): EXCISION CYST SNUFFBOX LEFT WRIST (Left)  SURGEON:  Surgeon(s) and Role:    * Daryll Brod, MD - Primary  PHYSICIAN ASSISTANT:   ASSISTANTS: none   ANESTHESIA:   local and general  EBL:  Total I/O In: 700 [I.V.:700] Out: -   BLOOD ADMINISTERED:none  DRAINS: none   LOCAL MEDICATIONS USED:  BUPIVICAINE   SPECIMEN: excision  DISPOSITION OF SPECIMEN:  PATHOLOGY  COUNTS:  YES  TOURNIQUET:   Total Tourniquet Time Documented: Upper Arm (Left) - 24 minutes Total: Upper Arm (Left) - 24 minutes   DICTATION: .Other Dictation: Dictation Number [id number]  PLAN OF CARE: Discharge to home after PACU  PATIENT DISPOSITION:  PACU - hemodynamically stable.

## 2015-02-02 NOTE — Transfer of Care (Signed)
Immediate Anesthesia Transfer of Care Note  Patient: Donald Warner  Procedure(s) Performed: Procedure(s): EXCISION CYST SNUFFBOX LEFT WRIST (Left)  Patient Location: PACU  Anesthesia Type:General  Level of Consciousness: sedated  Airway & Oxygen Therapy: Patient Spontanous Breathing and Patient connected to face mask oxygen  Post-op Assessment: Report given to RN and Post -op Vital signs reviewed and stable  Post vital signs: Reviewed and stable  Last Vitals:  Filed Vitals:   02/02/15 0816  BP: 128/74  Pulse: 74  Temp: 36.8 C  Resp: 18    Complications: No apparent anesthesia complications

## 2015-02-02 NOTE — Discharge Instructions (Addendum)

## 2015-02-02 NOTE — Op Note (Signed)
Dictation Number [id number]

## 2015-02-02 NOTE — Anesthesia Preprocedure Evaluation (Addendum)
Anesthesia Evaluation  Patient identified by MRN, date of birth, ID band Patient awake    Reviewed: Allergy & Precautions, H&P , NPO status , Patient's Chart, lab work & pertinent test results  Airway Mallampati: I  TM Distance: >3 FB Neck ROM: full    Dental  (+) Teeth Intact, Dental Advidsory Given   Pulmonary former smoker,    Pulmonary exam normal       Cardiovascular negative cardio ROS Normal cardiovascular exam    Neuro/Psych negative neurological ROS  negative psych ROS   GI/Hepatic Neg liver ROS, GERD-  ,  Endo/Other  negative endocrine ROS  Renal/GU negative Renal ROS     Musculoskeletal   Abdominal   Peds  Hematology   Anesthesia Other Findings   Reproductive/Obstetrics                           Anesthesia Physical Anesthesia Plan  ASA: II  Anesthesia Plan: General   Post-op Pain Management:    Induction:   Airway Management Planned: LMA  Additional Equipment:   Intra-op Plan:   Post-operative Plan:   Informed Consent: I have reviewed the patients History and Physical, chart, labs and discussed the procedure including the risks, benefits and alternatives for the proposed anesthesia with the patient or authorized representative who has indicated his/her understanding and acceptance.   Dental Advisory Given  Plan Discussed with: Anesthesiologist, CRNA and Surgeon  Anesthesia Plan Comments:       Anesthesia Quick Evaluation

## 2015-02-02 NOTE — H&P (Signed)
[name] is a 44 year-old right-hand dominant male complaining of a mass on the radial aspect of his left wrist. This has been present for approximately three months.  He is not complaining of any pain or swelling.  He is referred by Dr. Marisue Humble for this.  He has no history of injury, he has no history of diabetes, thyroid problems, arthritis or gout.  He states that it suddenly appeared. He has mild discomfort, no numbness or tingling.  He has not had any treatment for this.  He has had his ultrasound done revealing a cyst in the snuffbox area of his left wrist.  ALLERGIES:   None.  MEDICATIONS:    None.  SURGICAL HISTORY:    None.  FAMILY MEDICAL HISTORY:    Positive for high blood pressure, otherwise negative.  SOCIAL HISTORY:     He does not smoke, drinks socially.  He is married and a Biochemist, clinical for [employer].  REVIEW OF SYSTEMS:   Positive for glasses, contacts, pneumonia, otherwise negative 14 points.  Donald Warner is an 44 y.o. male.   Chief Complaint: mass left wrist HPI: see above  Past Medical History  Diagnosis Date  . GERD (gastroesophageal reflux disease)   . Bacterial pneumonia March 2011  . ALLERGIC RHINITIS   . IgA deficiency     Past Surgical History  Procedure Laterality Date  . Vasectomy    . Mouth surgery    . Esophagogastroduodenoscopy      Family History  Problem Relation Age of Onset  . Heart disease Father     s/p CABG  . Lung cancer Paternal Grandmother   . Emphysema Paternal Grandfather    Social History:  reports that he quit smoking about 26 years ago. His smoking use included Cigarettes. He has a .3 pack-year smoking history. He has never used smokeless tobacco. He reports that he does not drink alcohol or use illicit drugs.  Allergies: No Known Allergies  No prescriptions prior to admission    No results found for this or any previous visit (from the past 48 hour(s)).  No results found.   Pertinent items are noted in  HPI.  Height 5\' 8"  (1.727 m), weight 80.74 kg (178 lb).  General appearance: alert, cooperative and appears stated age Head: Normocephalic, without obvious abnormality Neck: no JVD Resp: clear to auscultation bilaterally Cardio: regular rate and rhythm, S1, S2 normal, no murmur, click, rub or gallop GI: soft, non-tender; bowel sounds normal; no masses,  no organomegaly Extremities: mass left wrist snuffbox area Pulses: 2+ and symmetric Skin: Skin color, texture, turgor normal. No rashes or lesions Neurologic: Grossly normal Incision/Wound: na  Assessment/Plan RADIOGRAPHS:     X-rays reveal no bony changes other than at the distal radial ulnar joint with arthritis.   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    PLAN: He would like to have this excised. We have discussed with him risks and complications. Pre, peri and post op care are discussed along with risks and complications. Patient is aware there is no guarantee with surgery, possibility of infection, injury to arteries, nerves, and tendons, incomplete relief and dystrophy. He is aware this may come from either the dorsal palmar aspect of his wrist and stalk will need to be followed to that area. As such we cannot tell him where the incisions will ultimately be. He would like to proceed. He is scheduled as an  outpatient under regional anesthesia for excision of snuffbox cyst left wrist.  Egidio Lofgren R 02/02/2015, 7:31 AM

## 2015-02-02 NOTE — Anesthesia Procedure Notes (Signed)
Procedure Name: LMA Insertion Date/Time: 02/02/2015 8:51 AM Performed by: Maryella Shivers Pre-anesthesia Checklist: Patient identified, Emergency Drugs available, Suction available and Patient being monitored Patient Re-evaluated:Patient Re-evaluated prior to inductionOxygen Delivery Method: Circle System Utilized Preoxygenation: Pre-oxygenation with 100% oxygen Intubation Type: IV induction Ventilation: Mask ventilation without difficulty LMA: LMA inserted LMA Size: 5.0 Number of attempts: 1 Airway Equipment and Method: bite block Placement Confirmation: positive ETCO2 Tube secured with: Tape Dental Injury: Teeth and Oropharynx as per pre-operative assessment

## 2015-02-03 ENCOUNTER — Encounter (HOSPITAL_BASED_OUTPATIENT_CLINIC_OR_DEPARTMENT_OTHER): Payer: Self-pay | Admitting: Orthopedic Surgery

## 2015-02-03 NOTE — Op Note (Signed)
NAMECAMDON, SAETERN               ACCOUNT NO.:  [id number]  MEDICAL RECORD NO.:  [id number]  LOCATION:                                 FACILITY:  PHYSICIAN:  Daryll Brod, M.D.       DATE OF BIRTH:  June 03, 1971  DATE OF PROCEDURE: DATE OF DISCHARGE:                              OPERATIVE REPORT   PREOPERATIVE DIAGNOSIS:  Mass of snuffbox, left wrist.  POSTOPERATIVE DIAGNOSIS:  Mass of snuffbox, left wrist.  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  SURGEON:  Daryll Brod, MD  ANESTHESIA:  General with local infiltration.  ANESTHESIOLOGIST:  Nelda Severe. Tobias Alexander, M.D.  HISTORY:  The patient is a 44 year old male with a history of a mass in the snuffbox of his left wrist, is desirous of having this excised. Ultrasound reveals this is a probable cyst.  He is aware of risks and complications including infection, recurrence of injury to arteries, nerves, tendons; incomplete relief of symptoms; dystrophy.  In preoperative area, the patient was seen, the extremity marked by both patient and surgeon.  Antibiotic given.  PROCEDURE IN DETAIL:  The patient was brought to the operating room, where a general anesthetic was carried out without difficulty.  He was prepped using ChloraPrep, supine position, left arm free.  A 3-minute dry time was allowed.  Time-out taken, confirming the patient and procedure.  A transverse incision was made directly over the mass, snuffbox of the wrist carried down through subcutaneous tissue.  This was to the left side.  The radial sensory nerves were identified and protected.  A dissection carried down to a multilobulated yellow brown- tan solid tumor.  With blunt and sharp dissection, this was dissected down to egress from the first dorsal compartment extensor tendons.  A portion of the extensor compartment dorsally was incised to allow visualization.  The radial artery was identified volarly.  With blunt and sharp dissection, the entire  mass was excised in toto.  The specimen was sent to Pathology.  It measured approximately 1-1.5 cm in diameter. The wound was copiously irrigated with saline.  Bleeders electrocauterized with bipolar.  The skin was then closed with a subcuticular 4-0 Monocryl suture, local infiltration with 0.25% bupivacaine without epinephrine was given, approximately 8 mL was used. Sterile compressive dressing and thumb spica splint was applied.  On deflation of the tourniquet, all fingers immediately pinked.  He was taken to the recovery room for observation in satisfactory condition.  He will be discharged home to return to the [facility] in 1 week on [place].          ______________________________ Daryll Brod, M.D.     GK/MEDQ  D:  02/02/2015  T:  02/02/2015  Job:  [id number]

## 2015-12-14 DIAGNOSIS — D1800 Hemangioma unspecified site: Secondary | ICD-10-CM | POA: Diagnosis not present

## 2015-12-14 DIAGNOSIS — L821 Other seborrheic keratosis: Secondary | ICD-10-CM | POA: Diagnosis not present

## 2015-12-14 DIAGNOSIS — D225 Melanocytic nevi of trunk: Secondary | ICD-10-CM | POA: Diagnosis not present

## 2015-12-14 DIAGNOSIS — L814 Other melanin hyperpigmentation: Secondary | ICD-10-CM | POA: Diagnosis not present

## 2016-04-16 DIAGNOSIS — Z0000 Encounter for general adult medical examination without abnormal findings: Secondary | ICD-10-CM | POA: Diagnosis not present

## 2017-01-14 DIAGNOSIS — L814 Other melanin hyperpigmentation: Secondary | ICD-10-CM | POA: Diagnosis not present

## 2017-01-14 DIAGNOSIS — D1800 Hemangioma unspecified site: Secondary | ICD-10-CM | POA: Diagnosis not present

## 2017-01-14 DIAGNOSIS — D225 Melanocytic nevi of trunk: Secondary | ICD-10-CM | POA: Diagnosis not present

## 2017-01-14 DIAGNOSIS — L821 Other seborrheic keratosis: Secondary | ICD-10-CM | POA: Diagnosis not present

## 2017-11-06 DIAGNOSIS — L720 Epidermal cyst: Secondary | ICD-10-CM | POA: Diagnosis not present

## 2018-02-12 DIAGNOSIS — D225 Melanocytic nevi of trunk: Secondary | ICD-10-CM | POA: Diagnosis not present

## 2018-02-12 DIAGNOSIS — L814 Other melanin hyperpigmentation: Secondary | ICD-10-CM | POA: Diagnosis not present

## 2018-02-12 DIAGNOSIS — L821 Other seborrheic keratosis: Secondary | ICD-10-CM | POA: Diagnosis not present

## 2018-02-12 DIAGNOSIS — D1800 Hemangioma unspecified site: Secondary | ICD-10-CM | POA: Diagnosis not present

## 2019-02-22 DIAGNOSIS — L814 Other melanin hyperpigmentation: Secondary | ICD-10-CM | POA: Diagnosis not present

## 2019-02-22 DIAGNOSIS — D225 Melanocytic nevi of trunk: Secondary | ICD-10-CM | POA: Diagnosis not present

## 2019-02-22 DIAGNOSIS — L821 Other seborrheic keratosis: Secondary | ICD-10-CM | POA: Diagnosis not present

## 2019-03-22 ENCOUNTER — Encounter: Payer: Self-pay | Admitting: Podiatry

## 2019-03-22 ENCOUNTER — Other Ambulatory Visit: Payer: Self-pay

## 2019-03-22 ENCOUNTER — Ambulatory Visit: Payer: BC Managed Care – PPO | Admitting: Podiatry

## 2019-03-22 VITALS — BP 122/85

## 2019-03-22 DIAGNOSIS — M79672 Pain in left foot: Secondary | ICD-10-CM | POA: Diagnosis not present

## 2019-03-22 DIAGNOSIS — B070 Plantar wart: Secondary | ICD-10-CM | POA: Diagnosis not present

## 2019-03-22 NOTE — Progress Notes (Signed)
  Subjective:  Patient ID: Donald Warner, male    DOB: 1970-12-26,  MRN: [id number]  Chief Complaint  Patient presents with  . Plantar Warts    pt is here for a plantars wart for the left foot, plantar forefoot submet 3rd toe, pain has been going on for about a month, but has been present for awhile    48 y.o. male presents with the above complaint.  Agree with the above complaints.  Patient denies using any kind open public spaces.  But it has been painful for couple for a month now.  He denies any over-the-counter therapy except for padding.   Review of Systems: Negative except as noted in the HPI. Denies N/V/F/Ch.  Past Medical History:  Diagnosis Date  . ALLERGIC RHINITIS   . Bacterial pneumonia March 2011  . GERD (gastroesophageal reflux disease)   . IgA deficiency ([place])    No current outpatient medications on file.  Social History   Tobacco Use  Smoking Status Former Smoker  . Packs/day: 0.10  . Years: 3.00  . Pack years: 0.30  . Types: Cigarettes  . Quit date: 06/10/1988  . Years since quitting: 30.8  Smokeless Tobacco Never Used    No Known Allergies Objective:   Vitals:   03/22/19 0807  BP: 122/85   There is no height or weight on file to calculate BMI. Constitutional Well developed. Well nourished.  Vascular Dorsalis pedis pulses palpable bilaterally. Posterior tibial pulses palpable bilaterally. Capillary refill normal to all digits.  No cyanosis or clubbing noted. Pedal hair growth normal.  Neurologic Normal speech. Oriented to person, place, and time. Epicritic sensation to light touch grossly present bilaterally.  Dermatologic  hyperkeratotic lesion noted at the plantar submet 3 of the left foot.  After debridement pinpoint bleeding was noted.  Consistent with plantar verruca.  No other lesions were identified.  Orthopedic: Normal joint ROM without pain or crepitus bilaterally. No visible deformities. No bony tenderness.   Radiographs: None  Assessment:   1. Plantar verruca   2. Left foot pain    Plan:  Patient was evaluated and treated and all questions answered.  Left submet 3 lesion --Lesion was debrided today without complications. Hemostasis was achieved and the area was cleaned. Cantharone was applied followed by an occlusive bandage. Post procedure complications were discussed. Monitor for signs or symptoms of infection and directed to call the office mainly should any occur.  I will see the patient in 2 weeks for another application.   Return in about 2 weeks (around 04/05/2019).

## 2019-04-05 ENCOUNTER — Other Ambulatory Visit: Payer: Self-pay

## 2019-04-05 ENCOUNTER — Ambulatory Visit: Payer: BC Managed Care – PPO | Admitting: Podiatry

## 2019-04-05 DIAGNOSIS — M79672 Pain in left foot: Secondary | ICD-10-CM

## 2019-04-05 DIAGNOSIS — B070 Plantar wart: Secondary | ICD-10-CM | POA: Diagnosis not present

## 2019-04-06 ENCOUNTER — Encounter: Payer: Self-pay | Admitting: Podiatry

## 2019-04-06 NOTE — Progress Notes (Signed)
  Subjective:  Patient ID: Donald Warner, male    DOB: Jan 10, 1971,  MRN: [id number]  Chief Complaint  Patient presents with  . Plantar Warts    left foot - pt states that it was painful during the first week. but soon after, has gradually gotten better. can still put some minor pressure on it    48 y.o. male presents with the above complaint.  Patient states that his pain has greatly improved.  He has been applying bandage daily he has wart on the left submet 3.  No other acute complaints he is here for another application of Cantharone therapy.   Review of Systems: Negative except as noted in the HPI. Denies N/V/F/Ch.  Past Medical History:  Diagnosis Date  . ALLERGIC RHINITIS   . Bacterial pneumonia March 2011  . GERD (gastroesophageal reflux disease)   . IgA deficiency ([place])    No current outpatient medications on file.  Social History   Tobacco Use  Smoking Status Former Smoker  . Packs/day: 0.10  . Years: 3.00  . Pack years: 0.30  . Types: Cigarettes  . Quit date: 06/10/1988  . Years since quitting: 30.8  Smokeless Tobacco Never Used    No Known Allergies Objective:  There were no vitals filed for this visit. There is no height or weight on file to calculate BMI. Constitutional Well developed. Well nourished.  Vascular Dorsalis pedis pulses palpable bilaterally. Posterior tibial pulses palpable bilaterally. Capillary refill normal to all digits.  No cyanosis or clubbing noted. Pedal hair growth normal.  Neurologic Normal speech. Oriented to person, place, and time. Epicritic sensation to light touch grossly present bilaterally.  Dermatologic  left submet 3 hyperkeratotic lesion.  Debrided using a chisel blade with pinpoint bleeding noted.  It appears wart has decreased in size.  Orthopedic: Normal joint ROM without pain or crepitus bilaterally. No visible deformities. No bony tenderness.   Radiographs: None Assessment:   1. Plantar verruca   2. Left  foot pain    Plan:  Patient was evaluated and treated and all questions answered.  Left submet 3 plantar verruca --Lesion was debrided today without complications. Hemostasis was achieved and the area was cleaned. Cantharone was applied followed by an occlusive bandage. Post procedure complications were discussed. Monitor for signs or symptoms of infection and directed to call the office mainly should any occur.   No follow-ups on file.

## 2019-04-23 ENCOUNTER — Other Ambulatory Visit: Payer: Self-pay

## 2019-04-23 ENCOUNTER — Ambulatory Visit: Payer: BC Managed Care – PPO | Admitting: Podiatry

## 2019-04-23 ENCOUNTER — Encounter: Payer: Self-pay | Admitting: Podiatry

## 2019-04-23 DIAGNOSIS — M79672 Pain in left foot: Secondary | ICD-10-CM | POA: Diagnosis not present

## 2019-04-23 DIAGNOSIS — B070 Plantar wart: Secondary | ICD-10-CM | POA: Diagnosis not present

## 2019-04-23 NOTE — Progress Notes (Signed)
  Subjective:  Patient ID: Donald Warner, male    DOB: [birth year],  MRN: [id number]  Chief Complaint  Patient presents with  . Follow-up    plantar verruca- pt states that it has healed up nice. no pain , no questions or concerns for today's visit.     48 y.o. male presents with the above complaint.  Patient presents with a follow-up from a left plantar midfoot wart.  Patient states he has been doing well.  It only hurt for a couple of days after the application of Cantharone therapy.  However he is doing much better.  He states that it has completely healed.  He denies any other acute complaints.     Review of Systems: Negative except as noted in the HPI. Denies N/V/F/Ch.  Past Medical History:  Diagnosis Date  . ALLERGIC RHINITIS   . Bacterial pneumonia March 2011  . GERD (gastroesophageal reflux disease)   . IgA deficiency ([place])    No current outpatient medications on file.  Social History   Tobacco Use  Smoking Status Former Smoker  . Packs/day: 0.10  . Years: 3.00  . Pack years: 0.30  . Types: Cigarettes  . Quit date: 06/10/1988  . Years since quitting: 30.8  Smokeless Tobacco Never Used    No Known Allergies Objective:  There were no vitals filed for this visit. There is no height or weight on file to calculate BMI. Constitutional Well developed. Well nourished.  Vascular Dorsalis pedis pulses palpable bilaterally. Posterior tibial pulses palpable bilaterally. Capillary refill normal to all digits.  No cyanosis or clubbing noted. Pedal hair growth normal.  Neurologic Normal speech. Oriented to person, place, and time. Epicritic sensation to light touch grossly present bilaterally.  Dermatologic  dry blister formation noted on the left plantar midfoot.  No clinical signs of infection noted.  Raw skin noted underneath.  No pain on palpation.  Orthopedic: Normal joint ROM without pain or crepitus bilaterally. No visible deformities. No bony tenderness.    Radiographs: None Assessment:   1. Plantar verruca   2. Left foot pain    Plan:  Patient was evaluated and treated and all questions answered.  Left midfoot plantar verruca -Using a chisel blade, hyperkeratotic lesion was debrided down.  No further pinpoint bleeding noted.  It appears that the wart has completely healed. -At this point I recommended that patient will no longer need any Cantharone therapy and patient is discharged from my care.  He can follow-up as needed.   Return if symptoms worsen or fail to improve.

## 2019-05-24 DIAGNOSIS — Z20828 Contact with and (suspected) exposure to other viral communicable diseases: Secondary | ICD-10-CM | POA: Diagnosis not present

## 2020-03-06 DIAGNOSIS — D225 Melanocytic nevi of trunk: Secondary | ICD-10-CM | POA: Diagnosis not present

## 2020-03-06 DIAGNOSIS — L814 Other melanin hyperpigmentation: Secondary | ICD-10-CM | POA: Diagnosis not present

## 2020-03-06 DIAGNOSIS — L578 Other skin changes due to chronic exposure to nonionizing radiation: Secondary | ICD-10-CM | POA: Diagnosis not present

## 2020-03-06 DIAGNOSIS — L821 Other seborrheic keratosis: Secondary | ICD-10-CM | POA: Diagnosis not present

## 2020-07-06 DIAGNOSIS — M549 Dorsalgia, unspecified: Secondary | ICD-10-CM | POA: Diagnosis not present

## 2020-07-06 DIAGNOSIS — F430 Acute stress reaction: Secondary | ICD-10-CM | POA: Diagnosis not present

## 2020-07-06 DIAGNOSIS — F411 Generalized anxiety disorder: Secondary | ICD-10-CM | POA: Diagnosis not present

## 2021-12-07 DIAGNOSIS — M25512 Pain in left shoulder: Secondary | ICD-10-CM | POA: Diagnosis not present

## 2021-12-16 DIAGNOSIS — M25512 Pain in left shoulder: Secondary | ICD-10-CM | POA: Diagnosis not present

## 2021-12-17 DIAGNOSIS — M75102 Unspecified rotator cuff tear or rupture of left shoulder, not specified as traumatic: Secondary | ICD-10-CM | POA: Diagnosis not present

## 2022-01-18 DIAGNOSIS — M7542 Impingement syndrome of left shoulder: Secondary | ICD-10-CM | POA: Diagnosis not present

## 2022-01-18 DIAGNOSIS — I890 Lymphedema, not elsewhere classified: Secondary | ICD-10-CM | POA: Diagnosis not present

## 2022-01-18 DIAGNOSIS — M7522 Bicipital tendinitis, left shoulder: Secondary | ICD-10-CM | POA: Diagnosis not present

## 2022-01-18 DIAGNOSIS — Y999 Unspecified external cause status: Secondary | ICD-10-CM | POA: Diagnosis not present

## 2022-01-18 DIAGNOSIS — M67814 Other specified disorders of tendon, left shoulder: Secondary | ICD-10-CM | POA: Diagnosis not present

## 2022-01-18 DIAGNOSIS — M75102 Unspecified rotator cuff tear or rupture of left shoulder, not specified as traumatic: Secondary | ICD-10-CM | POA: Diagnosis not present

## 2022-01-18 DIAGNOSIS — X58XXXA Exposure to other specified factors, initial encounter: Secondary | ICD-10-CM | POA: Diagnosis not present

## 2022-01-18 DIAGNOSIS — M94212 Chondromalacia, left shoulder: Secondary | ICD-10-CM | POA: Diagnosis not present

## 2022-01-18 DIAGNOSIS — S46012A Strain of muscle(s) and tendon(s) of the rotator cuff of left shoulder, initial encounter: Secondary | ICD-10-CM | POA: Diagnosis not present

## 2022-01-18 DIAGNOSIS — Z4789 Encounter for other orthopedic aftercare: Secondary | ICD-10-CM | POA: Diagnosis not present

## 2022-01-18 DIAGNOSIS — S43432A Superior glenoid labrum lesion of left shoulder, initial encounter: Secondary | ICD-10-CM | POA: Diagnosis not present

## 2022-01-18 DIAGNOSIS — M19012 Primary osteoarthritis, left shoulder: Secondary | ICD-10-CM | POA: Diagnosis not present

## 2022-01-18 DIAGNOSIS — G8918 Other acute postprocedural pain: Secondary | ICD-10-CM | POA: Diagnosis not present

## 2022-01-18 DIAGNOSIS — M25512 Pain in left shoulder: Secondary | ICD-10-CM | POA: Diagnosis not present

## 2022-01-28 DIAGNOSIS — Z4789 Encounter for other orthopedic aftercare: Secondary | ICD-10-CM | POA: Diagnosis not present

## 2022-01-28 DIAGNOSIS — M25512 Pain in left shoulder: Secondary | ICD-10-CM | POA: Diagnosis not present

## 2022-01-28 DIAGNOSIS — M25612 Stiffness of left shoulder, not elsewhere classified: Secondary | ICD-10-CM | POA: Diagnosis not present

## 2022-01-28 DIAGNOSIS — M75102 Unspecified rotator cuff tear or rupture of left shoulder, not specified as traumatic: Secondary | ICD-10-CM | POA: Diagnosis not present

## 2022-01-28 DIAGNOSIS — I890 Lymphedema, not elsewhere classified: Secondary | ICD-10-CM | POA: Diagnosis not present

## 2022-02-04 DIAGNOSIS — M25612 Stiffness of left shoulder, not elsewhere classified: Secondary | ICD-10-CM | POA: Diagnosis not present

## 2022-02-04 DIAGNOSIS — M25512 Pain in left shoulder: Secondary | ICD-10-CM | POA: Diagnosis not present

## 2022-02-12 DIAGNOSIS — M25612 Stiffness of left shoulder, not elsewhere classified: Secondary | ICD-10-CM | POA: Diagnosis not present

## 2022-02-12 DIAGNOSIS — M25512 Pain in left shoulder: Secondary | ICD-10-CM | POA: Diagnosis not present

## 2022-02-20 DIAGNOSIS — M25512 Pain in left shoulder: Secondary | ICD-10-CM | POA: Diagnosis not present

## 2022-02-20 DIAGNOSIS — M25612 Stiffness of left shoulder, not elsewhere classified: Secondary | ICD-10-CM | POA: Diagnosis not present

## 2022-02-27 DIAGNOSIS — M25512 Pain in left shoulder: Secondary | ICD-10-CM | POA: Diagnosis not present

## 2022-02-27 DIAGNOSIS — M25612 Stiffness of left shoulder, not elsewhere classified: Secondary | ICD-10-CM | POA: Diagnosis not present

## 2022-03-01 DIAGNOSIS — M25512 Pain in left shoulder: Secondary | ICD-10-CM | POA: Diagnosis not present

## 2022-03-01 DIAGNOSIS — M25612 Stiffness of left shoulder, not elsewhere classified: Secondary | ICD-10-CM | POA: Diagnosis not present

## 2022-03-06 DIAGNOSIS — L578 Other skin changes due to chronic exposure to nonionizing radiation: Secondary | ICD-10-CM | POA: Diagnosis not present

## 2022-03-06 DIAGNOSIS — D225 Melanocytic nevi of trunk: Secondary | ICD-10-CM | POA: Diagnosis not present

## 2022-03-06 DIAGNOSIS — L821 Other seborrheic keratosis: Secondary | ICD-10-CM | POA: Diagnosis not present

## 2022-03-06 DIAGNOSIS — L814 Other melanin hyperpigmentation: Secondary | ICD-10-CM | POA: Diagnosis not present

## 2022-03-11 DIAGNOSIS — M25612 Stiffness of left shoulder, not elsewhere classified: Secondary | ICD-10-CM | POA: Diagnosis not present

## 2022-03-11 DIAGNOSIS — M25512 Pain in left shoulder: Secondary | ICD-10-CM | POA: Diagnosis not present

## 2022-03-13 DIAGNOSIS — M25612 Stiffness of left shoulder, not elsewhere classified: Secondary | ICD-10-CM | POA: Diagnosis not present

## 2022-03-13 DIAGNOSIS — M25512 Pain in left shoulder: Secondary | ICD-10-CM | POA: Diagnosis not present

## 2022-03-18 DIAGNOSIS — M25512 Pain in left shoulder: Secondary | ICD-10-CM | POA: Diagnosis not present

## 2022-03-18 DIAGNOSIS — M25612 Stiffness of left shoulder, not elsewhere classified: Secondary | ICD-10-CM | POA: Diagnosis not present

## 2022-03-20 DIAGNOSIS — M25512 Pain in left shoulder: Secondary | ICD-10-CM | POA: Diagnosis not present

## 2022-03-20 DIAGNOSIS — M25612 Stiffness of left shoulder, not elsewhere classified: Secondary | ICD-10-CM | POA: Diagnosis not present

## 2022-03-25 DIAGNOSIS — M25512 Pain in left shoulder: Secondary | ICD-10-CM | POA: Diagnosis not present

## 2022-03-25 DIAGNOSIS — M25612 Stiffness of left shoulder, not elsewhere classified: Secondary | ICD-10-CM | POA: Diagnosis not present

## 2022-04-01 DIAGNOSIS — M25612 Stiffness of left shoulder, not elsewhere classified: Secondary | ICD-10-CM | POA: Diagnosis not present

## 2022-04-01 DIAGNOSIS — M25512 Pain in left shoulder: Secondary | ICD-10-CM | POA: Diagnosis not present

## 2022-04-04 DIAGNOSIS — M25512 Pain in left shoulder: Secondary | ICD-10-CM | POA: Diagnosis not present

## 2022-04-04 DIAGNOSIS — M25612 Stiffness of left shoulder, not elsewhere classified: Secondary | ICD-10-CM | POA: Diagnosis not present

## 2022-04-08 DIAGNOSIS — M25512 Pain in left shoulder: Secondary | ICD-10-CM | POA: Diagnosis not present

## 2022-04-08 DIAGNOSIS — M25612 Stiffness of left shoulder, not elsewhere classified: Secondary | ICD-10-CM | POA: Diagnosis not present

## 2022-04-10 DIAGNOSIS — M25612 Stiffness of left shoulder, not elsewhere classified: Secondary | ICD-10-CM | POA: Diagnosis not present

## 2022-04-10 DIAGNOSIS — M25512 Pain in left shoulder: Secondary | ICD-10-CM | POA: Diagnosis not present

## 2022-04-15 DIAGNOSIS — M25612 Stiffness of left shoulder, not elsewhere classified: Secondary | ICD-10-CM | POA: Diagnosis not present

## 2022-04-15 DIAGNOSIS — M25512 Pain in left shoulder: Secondary | ICD-10-CM | POA: Diagnosis not present

## 2022-04-17 DIAGNOSIS — M25512 Pain in left shoulder: Secondary | ICD-10-CM | POA: Diagnosis not present

## 2022-04-17 DIAGNOSIS — M25612 Stiffness of left shoulder, not elsewhere classified: Secondary | ICD-10-CM | POA: Diagnosis not present

## 2022-04-19 DIAGNOSIS — L720 Epidermal cyst: Secondary | ICD-10-CM | POA: Diagnosis not present

## 2022-04-19 DIAGNOSIS — L723 Sebaceous cyst: Secondary | ICD-10-CM | POA: Diagnosis not present

## 2022-04-22 DIAGNOSIS — M25612 Stiffness of left shoulder, not elsewhere classified: Secondary | ICD-10-CM | POA: Diagnosis not present

## 2022-04-22 DIAGNOSIS — M25512 Pain in left shoulder: Secondary | ICD-10-CM | POA: Diagnosis not present

## 2022-04-24 DIAGNOSIS — M25612 Stiffness of left shoulder, not elsewhere classified: Secondary | ICD-10-CM | POA: Diagnosis not present

## 2022-04-24 DIAGNOSIS — M25512 Pain in left shoulder: Secondary | ICD-10-CM | POA: Diagnosis not present

## 2022-04-29 DIAGNOSIS — M25512 Pain in left shoulder: Secondary | ICD-10-CM | POA: Diagnosis not present

## 2022-04-29 DIAGNOSIS — M25612 Stiffness of left shoulder, not elsewhere classified: Secondary | ICD-10-CM | POA: Diagnosis not present

## 2022-05-01 DIAGNOSIS — M25512 Pain in left shoulder: Secondary | ICD-10-CM | POA: Diagnosis not present

## 2022-05-01 DIAGNOSIS — M25612 Stiffness of left shoulder, not elsewhere classified: Secondary | ICD-10-CM | POA: Diagnosis not present
